# Patient Record
Sex: Female | Born: 1960 | Race: White | Hispanic: No | Marital: Married | Smoking: Never smoker
Health system: Southern US, Community
[De-identification: ages and names within clinical notes are randomized; demographics above are authoritative.]

## PROBLEM LIST (undated history)

## (undated) DIAGNOSIS — C439 Malignant melanoma of skin, unspecified: Secondary | ICD-10-CM

## (undated) HISTORY — PX: OTHER SURGICAL HISTORY: SHX169

## (undated) HISTORY — PX: TUBAL LIGATION: SHX77

---

## 1998-03-16 ENCOUNTER — Other Ambulatory Visit: Admission: RE | Admit: 1998-03-16 | Discharge: 1998-03-16 | Payer: Self-pay | Admitting: Obstetrics and Gynecology

## 1999-03-27 ENCOUNTER — Other Ambulatory Visit: Admission: RE | Admit: 1999-03-27 | Discharge: 1999-03-27 | Payer: Self-pay | Admitting: Obstetrics and Gynecology

## 2000-03-31 ENCOUNTER — Other Ambulatory Visit: Admission: RE | Admit: 2000-03-31 | Discharge: 2000-03-31 | Payer: Self-pay | Admitting: Obstetrics and Gynecology

## 2001-04-12 ENCOUNTER — Other Ambulatory Visit: Admission: RE | Admit: 2001-04-12 | Discharge: 2001-04-12 | Payer: Self-pay | Admitting: Obstetrics and Gynecology

## 2002-01-10 ENCOUNTER — Encounter: Admission: RE | Admit: 2002-01-10 | Discharge: 2002-01-10 | Payer: Self-pay | Admitting: Obstetrics and Gynecology

## 2002-01-10 ENCOUNTER — Encounter: Payer: Self-pay | Admitting: Obstetrics and Gynecology

## 2003-01-19 ENCOUNTER — Encounter: Payer: Self-pay | Admitting: Obstetrics and Gynecology

## 2003-01-19 ENCOUNTER — Encounter: Admission: RE | Admit: 2003-01-19 | Discharge: 2003-01-19 | Payer: Self-pay | Admitting: Obstetrics and Gynecology

## 2004-02-05 ENCOUNTER — Encounter: Admission: RE | Admit: 2004-02-05 | Discharge: 2004-02-05 | Payer: Self-pay | Admitting: Obstetrics and Gynecology

## 2005-03-10 ENCOUNTER — Encounter: Admission: RE | Admit: 2005-03-10 | Discharge: 2005-03-10 | Payer: Self-pay | Admitting: Obstetrics and Gynecology

## 2006-06-10 ENCOUNTER — Encounter: Admission: RE | Admit: 2006-06-10 | Discharge: 2006-06-10 | Payer: Self-pay | Admitting: Obstetrics and Gynecology

## 2007-10-19 ENCOUNTER — Encounter: Admission: RE | Admit: 2007-10-19 | Discharge: 2007-10-19 | Payer: Self-pay | Admitting: Family Medicine

## 2008-11-20 ENCOUNTER — Encounter: Admission: RE | Admit: 2008-11-20 | Discharge: 2008-11-20 | Payer: Self-pay | Admitting: Family Medicine

## 2009-11-19 ENCOUNTER — Ambulatory Visit (HOSPITAL_COMMUNITY): Admission: RE | Admit: 2009-11-19 | Discharge: 2009-11-20 | Payer: Self-pay | Admitting: General Surgery

## 2009-12-17 ENCOUNTER — Inpatient Hospital Stay (HOSPITAL_COMMUNITY): Admission: RE | Admit: 2009-12-17 | Discharge: 2009-12-21 | Payer: Self-pay | Admitting: General Surgery

## 2009-12-17 ENCOUNTER — Encounter (INDEPENDENT_AMBULATORY_CARE_PROVIDER_SITE_OTHER): Payer: Self-pay | Admitting: General Surgery

## 2010-01-04 ENCOUNTER — Encounter: Admission: RE | Admit: 2010-01-04 | Discharge: 2010-01-04 | Payer: Self-pay | Admitting: Family Medicine

## 2010-11-24 ENCOUNTER — Encounter: Payer: Self-pay | Admitting: Obstetrics and Gynecology

## 2010-11-25 ENCOUNTER — Encounter: Payer: Self-pay | Admitting: Family Medicine

## 2010-12-23 ENCOUNTER — Other Ambulatory Visit: Payer: Self-pay | Admitting: Family Medicine

## 2010-12-23 DIAGNOSIS — Z1231 Encounter for screening mammogram for malignant neoplasm of breast: Secondary | ICD-10-CM

## 2011-01-06 ENCOUNTER — Ambulatory Visit
Admission: RE | Admit: 2011-01-06 | Discharge: 2011-01-06 | Disposition: A | Discharge status: Home / Self Care | Payer: Self-pay | Source: Ambulatory Visit | Attending: Family Medicine | Admitting: Family Medicine

## 2011-01-06 DIAGNOSIS — Z1231 Encounter for screening mammogram for malignant neoplasm of breast: Secondary | ICD-10-CM

## 2011-01-20 LAB — ANAEROBIC CULTURE

## 2011-01-20 LAB — CULTURE, ROUTINE-ABSCESS

## 2011-01-20 LAB — CBC
Hemoglobin: 13.5 g/dL (ref 12.0–15.0)
Platelets: 346 10*3/uL (ref 150–400)
RBC: 4.41 MIL/uL (ref 3.87–5.11)
RDW: 13 % (ref 11.5–15.5)

## 2011-01-23 LAB — CBC
HCT: 39 % (ref 36.0–46.0)
MCV: 89.8 fL (ref 78.0–100.0)

## 2011-01-23 LAB — GLUCOSE, CAPILLARY: Glucose-Capillary: 142 mg/dL — ABNORMAL HIGH (ref 70–99)

## 2012-01-07 ENCOUNTER — Other Ambulatory Visit: Payer: Self-pay | Admitting: Obstetrics and Gynecology

## 2012-01-07 DIAGNOSIS — Z1231 Encounter for screening mammogram for malignant neoplasm of breast: Secondary | ICD-10-CM

## 2012-01-14 ENCOUNTER — Ambulatory Visit
Admission: RE | Admit: 2012-01-14 | Discharge: 2012-01-14 | Disposition: A | Discharge status: Home / Self Care | Payer: BC Managed Care – PPO | Source: Ambulatory Visit | Attending: Obstetrics and Gynecology | Admitting: Obstetrics and Gynecology

## 2012-01-14 DIAGNOSIS — Z1231 Encounter for screening mammogram for malignant neoplasm of breast: Secondary | ICD-10-CM

## 2012-05-12 ENCOUNTER — Other Ambulatory Visit: Payer: Self-pay | Admitting: Family Medicine

## 2012-05-12 DIAGNOSIS — Z780 Asymptomatic menopausal state: Secondary | ICD-10-CM

## 2012-05-21 ENCOUNTER — Ambulatory Visit
Admission: RE | Admit: 2012-05-21 | Discharge: 2012-05-21 | Disposition: A | Discharge status: Home / Self Care | Payer: BC Managed Care – PPO | Source: Ambulatory Visit | Attending: Family Medicine | Admitting: Family Medicine

## 2012-05-21 DIAGNOSIS — Z780 Asymptomatic menopausal state: Secondary | ICD-10-CM

## 2012-07-03 ENCOUNTER — Encounter (HOSPITAL_COMMUNITY): Payer: Self-pay | Admitting: *Deleted

## 2012-07-03 ENCOUNTER — Emergency Department (HOSPITAL_COMMUNITY)
Admission: EM | Admit: 2012-07-03 | Discharge: 2012-07-03 | Disposition: A | Discharge status: Home / Self Care | Payer: BC Managed Care – PPO | Source: Home / Self Care | Attending: Family Medicine | Admitting: Family Medicine

## 2012-07-03 DIAGNOSIS — J069 Acute upper respiratory infection, unspecified: Secondary | ICD-10-CM

## 2012-07-03 HISTORY — DX: Malignant melanoma of skin, unspecified: C43.9

## 2012-07-03 NOTE — ED Notes (Signed)
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

## 2012-07-03 NOTE — ED Provider Notes (Signed)
History     CSN: [id number]  Arrival date & time 07/03/12  1103   First MD Initiated Contact with Patient 07/03/12 1122      Chief Complaint  Patient presents with  . Nasal Congestion  . Cough    (Consider location/radiation/quality/duration/timing/severity/associated sxs/prior treatment) Patient is a 51 y.o. female presenting with cough. The history is provided by the patient.  Cough This is a new problem. Episode onset: advised last eve of possible pertussis exposure, pt had tdap booster earlier this year, pt had sl cough and congestion which have improved. The cough is non-productive. There has been no fever. Associated symptoms include rhinorrhea.    Past Medical History  Diagnosis Date  . Melanoma     Past Surgical History  Procedure Date  . Tubal ligation   . Cesarean section   . Necrosis      History reviewed. No pertinent family history.  History  Substance Use Topics  . Smoking status: Never Smoker   . Smokeless tobacco: Not on file  . Alcohol Use: No    OB History    Grav Para Term Preterm Abortions TAB SAB Ect Mult Living                  Review of Systems  Constitutional: Negative.   HENT: Positive for rhinorrhea.   Respiratory: Positive for cough.   Gastrointestinal: Negative.     Allergies  Septra  Home Medications   Current Outpatient Rx  Name Route Sig Dispense Refill  . BIMATOPROST 0.03 % EX SOLN Both Eyes Place into both eyes at bedtime. Place one drop on applicator and apply evenly along the skin of the upper eyelid at base of eyelashes once daily at bedtime; repeat procedure for second eye (use a clean applicator).    . CALCIUM CITRATE-VITAMIN D 250-100 MG-UNIT PO TABS Oral Take 1 tablet by mouth 2 (two) times daily.    Marland Kitchen ZICAM COLD REMEDY PO Oral Take by mouth.    Lenn Sink PED COUGH/COLD CF PO Oral Take by mouth.      BP 139/79  Pulse 73  Temp 98.7 F (37.1 C) (Oral)  Resp 17  SpO2 100%  Physical Exam  Nursing note  and vitals reviewed. Constitutional: She is oriented to person, place, and time. She appears well-developed and well-nourished.  HENT:  Head: Normocephalic.  Right Ear: External ear normal.  Left Ear: External ear normal.  Nose: Nose normal.  Mouth/Throat: Oropharynx is clear and moist.  Eyes: Pupils are equal, round, and reactive to light.  Neck: Normal range of motion. Neck supple.  Cardiovascular: Normal rate, regular rhythm, normal heart sounds and intact distal pulses.   Pulmonary/Chest: Effort normal and breath sounds normal.  Lymphadenopathy:    She has no cervical adenopathy.  Neurological: She is alert and oriented to person, place, and time.  Skin: Skin is warm and dry.    ED Course  Procedures (including critical care time)  Labs Reviewed - No data to display No results found.   1. URI (upper respiratory infection)       MDM         Linna Hoff, MD 07/03/12 1136

## 2012-07-03 NOTE — ED Notes (Signed)
Up to date TDAP may

## 2012-12-29 ENCOUNTER — Other Ambulatory Visit: Payer: Self-pay

## 2012-12-29 ENCOUNTER — Other Ambulatory Visit: Payer: Self-pay | Admitting: Family Medicine

## 2012-12-29 DIAGNOSIS — Z1231 Encounter for screening mammogram for malignant neoplasm of breast: Secondary | ICD-10-CM

## 2013-01-26 ENCOUNTER — Ambulatory Visit
Admission: RE | Admit: 2013-01-26 | Discharge: 2013-01-26 | Disposition: A | Discharge status: Home / Self Care | Payer: BC Managed Care – PPO | Source: Ambulatory Visit

## 2013-01-26 DIAGNOSIS — Z1231 Encounter for screening mammogram for malignant neoplasm of breast: Secondary | ICD-10-CM

## 2013-12-27 ENCOUNTER — Other Ambulatory Visit: Payer: Self-pay

## 2013-12-27 DIAGNOSIS — Z1231 Encounter for screening mammogram for malignant neoplasm of breast: Secondary | ICD-10-CM

## 2014-01-27 ENCOUNTER — Ambulatory Visit
Admission: RE | Admit: 2014-01-27 | Discharge: 2014-01-27 | Disposition: A | Discharge status: Home / Self Care | Payer: BC Managed Care – PPO | Source: Ambulatory Visit

## 2014-01-27 DIAGNOSIS — Z1231 Encounter for screening mammogram for malignant neoplasm of breast: Secondary | ICD-10-CM

## 2014-04-03 ENCOUNTER — Ambulatory Visit (INDEPENDENT_AMBULATORY_CARE_PROVIDER_SITE_OTHER): Payer: BC Managed Care – PPO | Admitting: Physician Assistant

## 2014-04-03 VITALS — BP 110/80 | HR 64 | Temp 97.5°F | Resp 18 | Ht 66.0 in | Wt 162.0 lb

## 2014-04-03 DIAGNOSIS — F411 Generalized anxiety disorder: Secondary | ICD-10-CM

## 2014-04-03 DIAGNOSIS — F419 Anxiety disorder, unspecified: Secondary | ICD-10-CM

## 2014-04-03 LAB — TSH: TSH: 1.193 u[IU]/mL (ref 0.350–4.500)

## 2014-04-03 MED ORDER — ESCITALOPRAM OXALATE 10 MG PO TABS: 10.0000 mg | ORAL_TABLET | Freq: Every day | ORAL | Status: DC

## 2014-04-03 NOTE — Patient Instructions (Signed)
Start the escitalopram (Lexapro).  We are starting with the smallest dose -  10mg  daily.  If the medicine makes you feel sleepy, you can take at bedtime - it lasts 24 hours, so time of day that you take it is not important  If you are seeing improvement but not complete resolution of symptoms, we can always increase the medication  There are fewer medication interactions with this medicine than with others in its class.  There are NO interactions between Lexapro and Cipro, Malarone, Benadryl, Dramamine, or Scopolamine  Please let me know if you are experiencing side effects that you cannot tolerate  This medicine can be discontinued suddenly, but if it causes you to feel bad, we would want to taper over a couple of weeks   Generalized Anxiety Disorder Generalized anxiety disorder (GAD) is a mental disorder. It interferes with life functions, including relationships, work, and school. GAD is different from normal anxiety, which everyone experiences at some point in their lives in response to specific life events and activities. Normal anxiety actually helps [place] prepare for and get through these life events and activities. Normal anxiety goes away after the event or activity is over.  GAD causes anxiety that is not necessarily related to specific events or activities. It also causes excess anxiety in proportion to specific events or activities. The anxiety associated with GAD is also difficult to control. GAD can vary from mild to severe. People with severe GAD can have intense waves of anxiety with physical symptoms (panic attacks).  SYMPTOMS The anxiety and worry associated with GAD are difficult to control. This anxiety and worry are related to many life events and activities and also occur more days than not for 6 months or longer. People with GAD also have three or more of the following symptoms (one or more in children):  Restlessness.   Fatigue.  Difficulty concentrating.    Irritability.  Muscle tension.  Difficulty sleeping or unsatisfying sleep. DIAGNOSIS GAD is diagnosed through an assessment by your caregiver. Your caregiver will ask you questions aboutyour mood,physical symptoms, and events in your life. Your caregiver may ask you about your medical history and use of alcohol or drugs, including prescription medications. Your caregiver may also do a physical exam and blood tests. Certain medical conditions and the use of certain substances can cause symptoms similar to those associated with GAD. Your caregiver may refer you to a mental health specialist for further evaluation. TREATMENT The following therapies are usually used to treat GAD:   Medication Antidepressant medication usually is prescribed for long-term daily control. Antianxiety medications may be added in severe cases, especially when panic attacks occur.   Talk therapy (psychotherapy) Certain types of talk therapy can be helpful in treating GAD by providing support, education, and guidance. A form of talk therapy called cognitive behavioral therapy can teach you healthy ways to think about and react to daily life events and activities.  Stress managementtechniques These include yoga, meditation, and exercise and can be very helpful when they are practiced regularly. A mental health specialist can help determine which treatment is best for you. Some people see improvement with one therapy. However, other people require a combination of therapies. Document Released: 02/14/2013 Document Reviewed: 02/14/2013 [facility] Patient Information 2014 [place], [place].

## 2014-04-03 NOTE — Progress Notes (Signed)
Subjective:    Patient ID: Jessica Branch, female    DOB: 1961/05/31, 53 y.o.   MRN: [id number]  HPI   Jessica Branch is a very pleasant 53 yr old female here to discuss anxiety.  Reports she had significant anxiety years ago and was treated with Paxil.  Believe she took this medicine for several years, has been off for probably 10 years now.  Recently with increased anxiety over the last 5 months or so.  Work is very stressful.  Stress at home - marriage falling apart.  She is seeing a counselor weekly who has been very beneficial.  She prefers not to be on medication but is getting to a point where she thinks she needs it.  Feels constantly anxious, wakes up with heart racing, feels on edge.  Goes from "zero to worst case scenario" in seconds.  Exercise is helping - she is currently walking and trying to get back into running.  Some difficulty sleeping - waking frequently.  She admits to sadness, especially about the ending of her marriage.  She denies guilt or hopelessness.  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    She is concerned about side effect profile of medications.  Particularly wants to avoid weight gain.  Interested in buspar  She does not smoke or drink and uses no substances  PCP is Dr Modena Morrow.  She is on leave until August.  Pt was supposed to see another provider but there has been some difficulty with scheduling and pt felt like she needed to be seen sooner  On June 20th pt leaves for mission trip to [place].  Concerned for potential SEs of medication and being away from medical care.  Additionally wants to make sure that medication will not interact with malarone, cipro, or scopolamine patches  Review of Systems  Constitutional: Negative.   Respiratory: Negative.   Cardiovascular: Positive for palpitations.  Musculoskeletal: Negative.   Skin: Negative.   Psychiatric/Behavioral: Positive for sleep disturbance.  Negative for dysphoric mood. The patient is nervous/anxious.        Objective:   Physical Exam  Vitals reviewed. Constitutional: She is oriented to person, place, and time. She appears well-developed and well-nourished. No distress.  HENT:  Head: Normocephalic and atraumatic.  Eyes: Conjunctivae are normal. No scleral icterus.  Cardiovascular: Normal rate, regular rhythm and normal heart sounds.   Pulmonary/Chest: Effort normal and breath sounds normal. She has no wheezes. She has no rales.  Neurological: She is alert and oriented to person, place, and time.  Skin: Skin is warm and dry.  Psychiatric: She has a normal mood and affect. Her speech is normal and behavior is normal. Judgment and thought content normal.   Results for orders placed in visit on 04/03/14  TSH      Result Value Ref Range   TSH 1.193  0.350 - 4.500 uIU/mL      Assessment & Plan:  Anxiety - Plan: TSH, escitalopram (LEXAPRO) 10 MG tablet   Jessica Branch is a very pleasant 53 yr old female here to initiate medication for anxiety.  We discussed medication options at length including side effect profiles.  Pt would like to avoid weight gain and would like medication to be as short term as possible.  She is interested in buspar but discussed with pt that first line treatment for anxiety begins with ssri.  I think pt's dominant problem is anxiety, but  she may have some underlying depression as well given feelings of sadness and some anhedonia.  I think ssri may be more beneficial than buspar.  With good response to paxil in the past, expect good symptom control with ssri.  Will try Lexapro as this has a better side effect profile.  There are no medication interactions that pt needs to worry about when traveling.  We discussed discontinuation - pt may stop abruptly as long as she feels ok, if discontinuation symptoms would have her taper  Start at 10mg  daily with possibility of increasing dose . Pt to touch base by phone  in 1 month.  Follow up in 8 weeks here or with PCP.  Sooner if concerns  Pt to call or RTC if worsening or not improving  E. Natividad Brood MHS, PA-C [facility] 6/2/20152:05 PM

## 2014-05-03 ENCOUNTER — Telehealth: Payer: Self-pay

## 2014-05-03 MED ORDER — ESCITALOPRAM OXALATE 20 MG PO TABS: 20.0000 mg | ORAL_TABLET | Freq: Every day | ORAL | Status: DC

## 2014-05-03 NOTE — Telephone Encounter (Signed)
Pt called in to report to Natividad Brood that she is seeing some positive results but would like to know if she can go up to the next dosage of Lexipro. If so she needs a new RX for the new dosage. She can be reached [phone] thank you

## 2014-05-03 NOTE — Telephone Encounter (Signed)
Great!  I'm so glad she's seeing some improvement!  I have sent a new rx to her pharmacy - we'll increase the dose to 20mg  daily.  Let's have her follow up in about 6-8 weeks.  Unfortunately, I will not be here, but maybe we can get her scheduled at 104 for follow up - can you forward to scheduling?  Thanks!

## 2014-05-03 NOTE — Telephone Encounter (Signed)
Advised pt. Transferred to schedule appt.

## 2014-06-16 ENCOUNTER — Ambulatory Visit (INDEPENDENT_AMBULATORY_CARE_PROVIDER_SITE_OTHER): Payer: BC Managed Care – PPO | Admitting: Family Medicine

## 2014-06-16 ENCOUNTER — Encounter: Payer: Self-pay | Admitting: Family Medicine

## 2014-06-16 VITALS — BP 102/80 | HR 62 | Temp 98.3°F | Resp 16 | Ht 66.25 in | Wt 168.0 lb

## 2014-06-16 DIAGNOSIS — F419 Anxiety disorder, unspecified: Secondary | ICD-10-CM

## 2014-06-16 DIAGNOSIS — F411 Generalized anxiety disorder: Secondary | ICD-10-CM

## 2014-06-16 MED ORDER — ESCITALOPRAM OXALATE 20 MG PO TABS: 20.0000 mg | ORAL_TABLET | Freq: Every day | ORAL | Status: DC

## 2014-06-16 NOTE — Progress Notes (Signed)
Chief Complaint:  Chief Complaint  Patient presents with  . Follow-up    MEDICATION - LEXAPRO    HPI: Jessica Branch is a 53 y.o. female who is here for Lexapro refill. She is Doing well. No SI/HI/Hallucinations No SEs  Past Medical History  Diagnosis Date  . Melanoma    Past Surgical History  Procedure Laterality Date  . Tubal ligation    . Cesarean section    . Necrosis      History   Social History  . Marital Status: Married    Spouse Name: N/A    Number of Children: N/A  . Years of Education: N/A   Social History Main Topics  . Smoking status: Never Smoker   . Smokeless tobacco: None  . Alcohol Use: No  . Drug Use: No  . Sexual Activity:    Other Topics Concern  . None   Social History Narrative  . None   No family history on file. Allergies  Allergen Reactions  . Septra [Sulfamethoxazole-Trimethoprim]    Prior to Admission medications   Medication Sig Start Date End Date Taking? Authorizing Provider  b complex vitamins capsule Take 1 capsule by mouth daily.   Yes Historical Provider, MD  bimatoprost (LATISSE) 0.03 % ophthalmic solution Place into both eyes at bedtime. Place one drop on applicator and apply evenly along the skin of the upper eyelid at base of eyelashes once daily at bedtime; repeat procedure for second eye (use a clean applicator).   Yes Historical Provider, MD  calcium-vitamin D 250-100 MG-UNIT per tablet Take 1 tablet by mouth 2 (two) times daily.   Yes Historical Provider, MD  escitalopram (LEXAPRO) 20 MG tablet Take 1 tablet (20 mg total) by mouth daily. 06/16/14  Yes Tyia Binford P Kalilah Barua, DO  Multiple Vitamins-Minerals (MULTIVITAMIN WITH MINERALS) tablet Take 1 tablet by mouth daily.   Yes Historical Provider, MD     ROS: The patient denies fevers, chills, night sweats, unintentional weight loss, chest pain, palpitations, wheezing, dyspnea on exertion, nausea, vomiting, abdominal pain, dysuria, hematuria, melena, numbness, weakness,  or tingling.   All other systems have been reviewed and were otherwise negative with the exception of those mentioned in the HPI and as above.    PHYSICAL EXAM: Filed Vitals:   06/16/14 0833  BP: 102/80  Pulse: 62  Temp: 98.3 F (36.8 C)  Resp: 16   Filed Vitals:   06/16/14 0833  Height: 5' 6.25" (1.683 m)  Weight: 168 lb (76.204 kg)   Body mass index is 26.9 kg/(m^2).  General: Alert, no acute distress HEENT:  Normocephalic, atraumatic, oropharynx patent. EOMI, PERRLA Cardiovascular:  Regular rate and rhythm, no rubs murmurs or gallops.  No Carotid bruits, radial pulse intact. No pedal edema.  Respiratory: Clear to auscultation bilaterally.  No wheezes, rales, or rhonchi.  No cyanosis, no use of accessory musculature GI: No organomegaly, abdomen is soft and non-tender, positive bowel sounds.  No masses. Skin: No rashes. Neurologic: Facial musculature symmetric. Psychiatric: Patient is appropriate throughout our interaction. Lymphatic: No cervical lymphadenopathy Musculoskeletal: Gait intact.   LABS: Results for orders placed in visit on 04/03/14  TSH      Result Value Ref Range   TSH 1.193  0.350 - 4.500 uIU/mL     EKG/XRAY:   Primary read interpreted by Dr. Marin Comment at [facility].   ASSESSMENT/PLAN: Encounter Diagnosis  Name Primary?  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 Her ultimate goal is to be off of the lexapro, we have talked about getting rechecked in 6 motnhs and also to notify us and get rechecked prior to weaning off so there is a plan. She has been on SSRIs/SNRIs int he past for situational anxiety/depressionadn hopes this is also the case  and she can utilize her new skills from [place].   Gross sideeffects, risk and benefits, and alternatives of medications d/w patient. Patient is aware that all medications have potential sideeffects and we are unable to predict every sideeffect or drug-drug interaction that may occur.  Jamareon Shimel, [place], DO 06/16/2014 3:26 PM

## 2014-12-14 DIAGNOSIS — M216X2 Other acquired deformities of left foot: Secondary | ICD-10-CM | POA: Insufficient documentation

## 2014-12-14 DIAGNOSIS — M2010 Hallux valgus (acquired), unspecified foot: Secondary | ICD-10-CM | POA: Insufficient documentation

## 2014-12-14 DIAGNOSIS — M205X9 Other deformities of toe(s) (acquired), unspecified foot: Secondary | ICD-10-CM | POA: Insufficient documentation

## 2015-01-22 ENCOUNTER — Other Ambulatory Visit: Payer: Self-pay

## 2015-01-22 DIAGNOSIS — Z1231 Encounter for screening mammogram for malignant neoplasm of breast: Secondary | ICD-10-CM

## 2015-02-05 ENCOUNTER — Ambulatory Visit
Admission: RE | Admit: 2015-02-05 | Discharge: 2015-02-05 | Disposition: A | Discharge status: Home / Self Care | Payer: BC Managed Care – PPO | Source: Ambulatory Visit

## 2015-02-05 ENCOUNTER — Encounter (INDEPENDENT_AMBULATORY_CARE_PROVIDER_SITE_OTHER): Payer: Self-pay

## 2015-02-05 DIAGNOSIS — Z1231 Encounter for screening mammogram for malignant neoplasm of breast: Secondary | ICD-10-CM

## 2015-08-30 ENCOUNTER — Ambulatory Visit (INDEPENDENT_AMBULATORY_CARE_PROVIDER_SITE_OTHER): Payer: BC Managed Care – PPO | Admitting: Family Medicine

## 2015-08-30 VITALS — BP 114/76 | HR 64 | Temp 98.6°F | Resp 16 | Ht 66.0 in | Wt 170.0 lb

## 2015-08-30 DIAGNOSIS — F419 Anxiety disorder, unspecified: Secondary | ICD-10-CM

## 2015-08-30 DIAGNOSIS — N898 Other specified noninflammatory disorders of vagina: Secondary | ICD-10-CM

## 2015-08-30 DIAGNOSIS — N950 Postmenopausal bleeding: Secondary | ICD-10-CM

## 2015-08-30 DIAGNOSIS — Z124 Encounter for screening for malignant neoplasm of cervix: Secondary | ICD-10-CM

## 2015-08-30 LAB — POCT URINE PREGNANCY: PREG TEST UR: NEGATIVE

## 2015-08-30 LAB — POCT WET + KOH PREP
TRICH BY WET PREP: ABSENT
YEAST BY WET PREP: ABSENT
Yeast by KOH: ABSENT

## 2015-08-30 MED ORDER — ESCITALOPRAM OXALATE 20 MG PO TABS: 20.0000 mg | ORAL_TABLET | Freq: Every day | ORAL | Status: DC

## 2015-08-30 NOTE — Progress Notes (Signed)
[facility] [address], [place] [address] [phone]  Date:  08/30/2015   Name:  Jessica Branch   DOB:  1961/05/19   MRN:  [id number]  PCP:  No primary care provider on file.    Chief Complaint: blood spotting and Vaginal Discharge   History of Present Illness:  Jessica Branch is a 54 y.o. very pleasant female patient who presents with the following:  Established pt here today with concern of vaginal discharge for a couple of weeks- it had been "just clear liquid". However last night she noted some blood- spotting from her vagina, some bright red.   This concerned her so she came in to be seen   She has an appt with her GYN next month.  Dr. Gaetano Net She has not noted any pain really- "every so often I felt like there was something there, but it's not really noticeable."   She has not noted any urinary sx such as dysuria, hematuria.    She is post- menopausal for 3-4 years.   She has not had intercourse in about a year or more- she and her husband separated nearly 6 months ago. No new partners.   Her last pap was approx 4 years ago  History of BTL and C/S in the past   She had a thin, small melanoma in the past- this was dx in 2013.  She is now followed by 6 month skin checks   She has used lexapro in the past but is no longer taking this. She does recognize that she has sx of depression, but has been trying to handle this with therapy.  She does not have any SI.  At this time she would like to have an rx for lexapro to take if needed, but does not know for sure if she wants to go back on this   There are no active problems to display for this patient.   Past Medical History  Diagnosis Date  . Melanoma [facility])     Past Surgical History  Procedure Laterality Date  . Tubal ligation    . Cesarean section    . Necrosis       Social History  Substance Use Topics  . Smoking status: Never Smoker   . Smokeless tobacco: Never Used  . Alcohol  Use: No    History reviewed. No pertinent family history.  Allergies  Allergen Reactions  . Septra [Sulfamethoxazole-Trimethoprim] Other (See Comments)    Pain in pelvic area   . Amoxicillin Rash    Medication list has been reviewed and updated.  Current Outpatient Prescriptions on File Prior to Visit  Medication Sig Dispense Refill  . b complex vitamins capsule Take 1 capsule by mouth daily.    . calcium-vitamin D 250-100 MG-UNIT per tablet Take 1 tablet by mouth 2 (two) times daily.    . Multiple Vitamins-Minerals (MULTIVITAMIN WITH MINERALS) tablet Take 1 tablet by mouth daily.    Marland Kitchen escitalopram (LEXAPRO) 20 MG tablet Take 1 tablet (20 mg total) by mouth daily. (Patient not taking: Reported on 08/30/2015) 30 tablet 11   No current facility-administered medications on file prior to visit.    Review of Systems:  As per HPI- otherwise negative.   Physical Examination: Filed Vitals:   08/30/15 1244  BP: 114/76  Pulse: 64  Temp: 98.6 F (37 C)  Resp: 16   Filed Vitals:   08/30/15 1244  Height: 5\' 6"  (1.676 m)  Weight: 170 lb (  77.111 kg)   Body mass index is 27.45 kg/(m^2). Ideal Body Weight: Weight in (lb) to have BMI = 25: 154.6  GEN: WDWN, NAD, Non-toxic, A & O x 3, looks well HEENT: Atraumatic, Normocephalic. Neck supple. No masses, No LAD. Ears and Nose: No external deformity. CV: RRR, No M/G/R. No JVD. No thrill. No extra heart sounds. PULM: CTA B, no wheezes, crackles, rhonchi. No retractions. No resp. distress. No accessory muscle use. ABD: S, NT, ND. No rebound. No HSM. EXTR: No c/c/e NEURO Normal gait.  PSYCH: Normally interactive. Conversant. Worried about meaning of bleeding, tearful at times Pelvic: normal vagina and cervix. Uterus normal, no CMT, no adnexal tendereness or masses There is scant blood from the cervical os in the vaginal canal   Results for orders placed or performed in visit on 08/30/15  POCT Wet + KOH Prep  Result Value Ref Range    Yeast by KOH Absent Present, Absent   Yeast by wet prep Absent Present, Absent   WBC by wet prep Few None, Few, Too numerous to count   Clue Cells Wet Prep HPF POC None None, Too numerous to count   Trich by wet prep Absent Present, Absent   Bacteria Wet Prep HPF POC None None, Few, Too numerous to count   Epithelial Cells By [employer] ([facility]) Few None, Few, Too numerous to count   RBC,UR,HPF,POC Moderate (A) None RBC/hpf  POCT urine pregnancy  Result Value Ref Range   Preg Test, Ur Negative Negative     Assessment and Plan: Post-menopausal bleeding - Plan: POCT urine pregnancy, Pap IG, CT/NG NAA, and HPV (high risk)  Vaginal irritation - Plan: POCT Wet + KOH Prep  Screening for cervical cancer - Plan: Pap IG, CT/NG NAA, and HPV (high risk)  Anxiety - Plan: escitalopram (LEXAPRO) 20 MG tablet  Here today with post-menopausal bleeding.  She is quite worried about what this might mean- tried to reassure her that as she attended to this promptly we hope all will be ok.  Was able to mover her appt with OBG to next week Did pap today Refilled her lexapro- she will think about going back on this. Asked her to contact me if we can help in any way  Signed Lamar Blinks, MD  11/2 at 3:00

## 2015-08-30 NOTE — Patient Instructions (Signed)
You have an appt with Dr. Gaetano Net on 11/2 at 3:20 pm- please arrive at 3pm.    By that time we should have your pap smear back which may be helpful Your tests today are normal  I am sorry that you are having this bleeding- we hope that all will be taken care of soon!  If you have any heavier bleeding, pain, or other concerns let me know  I did give you an rx for your lexapro to hold onto- please consider starting back on this if you feel like your depression is getting worse or becoming more bothersome

## 2015-09-03 LAB — PAP IG, CT-NG NAA, HPV HIGH-RISK
Chlamydia Probe Amp: NEGATIVE
GC PROBE AMP: NEGATIVE
HPV DNA High Risk: NOT DETECTED

## 2016-01-10 ENCOUNTER — Other Ambulatory Visit: Payer: Self-pay

## 2016-01-10 DIAGNOSIS — Z1231 Encounter for screening mammogram for malignant neoplasm of breast: Secondary | ICD-10-CM

## 2016-02-11 ENCOUNTER — Ambulatory Visit
Admission: RE | Admit: 2016-02-11 | Discharge: 2016-02-11 | Disposition: A | Discharge status: Home / Self Care | Payer: BC Managed Care – PPO | Source: Ambulatory Visit

## 2016-02-11 DIAGNOSIS — Z1231 Encounter for screening mammogram for malignant neoplasm of breast: Secondary | ICD-10-CM

## 2016-02-18 ENCOUNTER — Other Ambulatory Visit: Payer: Self-pay | Admitting: Obstetrics and Gynecology

## 2016-02-18 DIAGNOSIS — R928 Other abnormal and inconclusive findings on diagnostic imaging of breast: Secondary | ICD-10-CM

## 2016-02-19 ENCOUNTER — Ambulatory Visit
Admission: RE | Admit: 2016-02-19 | Discharge: 2016-02-19 | Disposition: A | Discharge status: Home / Self Care | Payer: BC Managed Care – PPO | Source: Ambulatory Visit | Attending: Obstetrics and Gynecology | Admitting: Obstetrics and Gynecology

## 2016-02-19 DIAGNOSIS — R928 Other abnormal and inconclusive findings on diagnostic imaging of breast: Secondary | ICD-10-CM

## 2016-02-22 ENCOUNTER — Other Ambulatory Visit: Payer: BC Managed Care – PPO

## 2016-04-25 ENCOUNTER — Ambulatory Visit (INDEPENDENT_AMBULATORY_CARE_PROVIDER_SITE_OTHER): Payer: BC Managed Care – PPO | Admitting: Family Medicine

## 2016-04-25 VITALS — BP 120/78 | HR 70 | Temp 98.2°F | Resp 17 | Ht 66.0 in | Wt 167.0 lb

## 2016-04-25 DIAGNOSIS — S39012A Strain of muscle, fascia and tendon of lower back, initial encounter: Secondary | ICD-10-CM

## 2016-04-25 MED ORDER — CYCLOBENZAPRINE HCL 10 MG PO TABS: 10.0000 mg | ORAL_TABLET | Freq: Three times a day (TID) | ORAL | Status: DC | PRN

## 2016-04-25 NOTE — Patient Instructions (Addendum)
Please come back in if you are not better in 2 weeks  Low Back Strain With Rehab A strain is an injury in which a tendon or muscle is torn. The muscles and tendons of the lower back are vulnerable to strains. However, these muscles and tendons are very strong and require a great force to be injured. Strains are classified into three categories. Grade 1 strains cause pain, but the tendon is not lengthened. Grade 2 strains include a lengthened ligament, due to the ligament being stretched or partially ruptured. With grade 2 strains there is still function, although the function may be decreased. Grade 3 strains involve a complete tear of the tendon or muscle, and function is usually impaired. SYMPTOMS   Pain in the lower back.  Pain that affects one side more than the other.  Pain that gets worse with movement and may be felt in the hip, buttocks, or back of the thigh.  Muscle spasms of the muscles in the back.  Swelling along the muscles of the back.  Loss of strength of the back muscles.  Crackling sound (crepitation) when the muscles are touched. CAUSES  Lower back strains occur when a force is placed on the muscles or tendons that is greater than they can handle. Common causes of injury include:  Prolonged overuse of the muscle-tendon units in the lower back, usually from incorrect posture.  A single violent injury or force applied to the back. RISK INCREASES WITH:  Sports that involve twisting forces on the spine or a lot of bending at the waist (football, rugby, weightlifting, bowling, golf, tennis, speed skating, racquetball, swimming, running, gymnastics, diving).  Poor strength and flexibility.  Failure to warm up properly before activity.  Family history of lower back pain or disk disorders.  Previous back injury or surgery (especially fusion).  Poor posture with lifting, especially heavy objects.  Prolonged sitting, especially with poor posture. PREVENTION   Learn  and use proper posture when sitting or lifting (maintain proper posture when sitting, lift using the knees and legs, not at the waist).  Warm up and stretch properly before activity.  Allow for adequate recovery between workouts.  Maintain physical fitness:  Strength, flexibility, and endurance.  Cardiovascular fitness. PROGNOSIS  If treated properly, lower back strains usually heal within 6 weeks. RELATED COMPLICATIONS   Recurring symptoms, resulting in a chronic problem.  Chronic inflammation, scarring, and partial muscle-tendon tear.  Delayed healing or resolution of symptoms.  Prolonged disability. TREATMENT  Treatment first involves the use of ice and medicine, to reduce pain and inflammation. The use of strengthening and stretching exercises may help reduce pain with activity. These exercises may be performed at home or with a therapist. Severe injuries may require referral to a therapist for further evaluation and treatment, such as ultrasound. Your caregiver may advise that you wear a back brace or corset, to help reduce pain and discomfort. Often, prolonged bed rest results in greater harm then benefit. Corticosteroid injections may be recommended. However, these should be reserved for the most serious cases. It is important to avoid using your back when lifting objects. At night, sleep on your back on a firm mattress with a pillow placed under your knees. If non-surgical treatment is unsuccessful, surgery may be needed.  MEDICATION   If pain medicine is needed, nonsteroidal anti-inflammatory medicines (aspirin and ibuprofen), or other minor pain relievers (acetaminophen), are often advised.  Do not take pain medicine for 7 days before surgery.  Prescription pain relievers  may be given, if your caregiver thinks they are needed. Use only as directed and only as much as you need.  Ointments applied to the skin may be helpful.  Corticosteroid injections may be given by your  caregiver. These injections should be reserved for the most serious cases, because they may only be given a certain number of times. HEAT AND COLD  Cold treatment (icing) should be applied for 10 to 15 minutes every 2 to 3 hours for inflammation and pain, and immediately after activity that aggravates your symptoms. Use ice packs or an ice massage.  Heat treatment may be used before performing stretching and strengthening activities prescribed by your caregiver, physical therapist, or athletic trainer. Use a heat pack or a warm water soak. SEEK MEDICAL CARE IF:   Symptoms get worse or do not improve in 2 to 4 weeks, despite treatment.  You develop numbness, weakness, or loss of bowel or bladder function.  New, unexplained symptoms develop. (Drugs used in treatment may produce side effects.) EXERCISES  RANGE OF MOTION (ROM) AND STRETCHING EXERCISES - Low Back Strain Most people with lower back pain will find that their symptoms get worse with excessive bending forward (flexion) or arching at the lower back (extension). The exercises which will help resolve your symptoms will focus on the opposite motion.  Your physician, physical therapist or athletic trainer will help you determine which exercises will be most helpful to resolve your lower back pain. Do not complete any exercises without first consulting with your caregiver. Discontinue any exercises which make your symptoms worse until you speak to your caregiver.  If you have pain, numbness or tingling which travels down into your buttocks, leg or foot, the goal of the therapy is for these symptoms to move closer to your back and eventually resolve. Sometimes, these leg symptoms will get better, but your lower back pain may worsen. This is typically an indication of progress in your rehabilitation. Be very alert to any changes in your symptoms and the activities in which you participated in the 24 hours prior to the change. Sharing this  information with your caregiver will allow him/her to most efficiently treat your condition.  These exercises may help you when beginning to rehabilitate your injury. Your symptoms may resolve with or without further involvement from your physician, physical therapist or athletic trainer. While completing these exercises, remember:  Restoring tissue flexibility helps normal motion to return to the joints. This allows healthier, less painful movement and activity.  An effective stretch should be held for at least 30 seconds.  A stretch should never be painful. You should only feel a gentle lengthening or release in the stretched tissue. FLEXION RANGE OF MOTION AND STRETCHING EXERCISES: STRETCH - Flexion, Single Knee to Chest   Lie on a firm bed or floor with both legs extended in front of you.  Keeping one leg in contact with the floor, bring your opposite knee to your chest. Hold your leg in place by either grabbing behind your thigh or at your knee.  Pull until you feel a gentle stretch in your lower back. Hold __________ seconds.  Slowly release your grasp and repeat the exercise with the opposite side. Repeat __________ times. Complete this exercise __________ times per day.  STRETCH - Flexion, Double Knee to Chest   Lie on a firm bed or floor with both legs extended in front of you.  Keeping one leg in contact with the floor, bring your opposite knee to  your chest.  Tense your stomach muscles to support your back and then lift your other knee to your chest. Hold your legs in place by either grabbing behind your thighs or at your knees.  Pull both knees toward your chest until you feel a gentle stretch in your lower back. Hold __________ seconds.  Tense your stomach muscles and slowly return one leg at a time to the floor. Repeat __________ times. Complete this exercise __________ times per day.  STRETCH - Low Trunk Rotation  Lie on a firm bed or floor. Keeping your legs in front  of you, bend your knees so they are both pointed toward the ceiling and your feet are flat on the floor.  Extend your arms out to the side. This will stabilize your upper body by keeping your shoulders in contact with the floor.  Gently and slowly drop both knees together to one side until you feel a gentle stretch in your lower back. Hold for __________ seconds.  Tense your stomach muscles to support your lower back as you bring your knees back to the starting position. Repeat the exercise to the other side. Repeat __________ times. Complete this exercise __________ times per day  EXTENSION RANGE OF MOTION AND FLEXIBILITY EXERCISES: STRETCH - Extension, Prone on Elbows   Lie on your stomach on the floor, a bed will be too soft. Place your palms about shoulder width apart and at the height of your head.  Place your elbows under your shoulders. If this is too painful, stack pillows under your chest.  Allow your body to relax so that your hips drop lower and make contact more completely with the floor.  Hold this position for __________ seconds.  Slowly return to lying flat on the floor. Repeat __________ times. Complete this exercise __________ times per day.  RANGE OF MOTION - Extension, Prone Press Ups  Lie on your stomach on the floor, a bed will be too soft. Place your palms about shoulder width apart and at the height of your head.  Keeping your back as relaxed as possible, slowly straighten your elbows while keeping your hips on the floor. You may adjust the placement of your hands to maximize your comfort. As you gain motion, your hands will come more underneath your shoulders.  Hold this position __________ seconds.  Slowly return to lying flat on the floor. Repeat __________ times. Complete this exercise __________ times per day.  RANGE OF MOTION- Quadruped, Neutral Spine   Assume a hands and knees position on a firm surface. Keep your hands under your shoulders and your  knees under your hips. You may place padding under your knees for comfort.  Drop your head and point your tail bone toward the ground below you. This will round out your lower back like an angry cat. Hold this position for __________ seconds.  Slowly lift your head and release your tail bone so that your back sags into a large arch, like an old horse.  Hold this position for __________ seconds.  Repeat this until you feel limber in your lower back.  Now, find your "sweet spot." This will be the most comfortable position somewhere between the two previous positions. This is your neutral spine. Once you have found this position, tense your stomach muscles to support your lower back.  Hold this position for __________ seconds. Repeat __________ times. Complete this exercise __________ times per day.  STRENGTHENING EXERCISES - Low Back Strain These exercises may help you when  beginning to rehabilitate your injury. These exercises should be done near your "sweet spot." This is the neutral, low-back arch, somewhere between fully rounded and fully arched, that is your least painful position. When performed in this safe range of motion, these exercises can be used for people who have either a flexion or extension based injury. These exercises may resolve your symptoms with or without further involvement from your physician, physical therapist or athletic trainer. While completing these exercises, remember:   Muscles can gain both the endurance and the strength needed for everyday activities through controlled exercises.  Complete these exercises as instructed by your physician, physical therapist or athletic trainer. Increase the resistance and repetitions only as guided.  You may experience muscle soreness or fatigue, but the pain or discomfort you are trying to eliminate should never worsen during these exercises. If this pain does worsen, stop and make certain you are following the directions  exactly. If the pain is still present after adjustments, discontinue the exercise until you can discuss the trouble with your caregiver. STRENGTHENING - Deep Abdominals, Pelvic Tilt  Lie on a firm bed or floor. Keeping your legs in front of you, bend your knees so they are both pointed toward the ceiling and your feet are flat on the floor.  Tense your lower abdominal muscles to press your lower back into the floor. This motion will rotate your pelvis so that your tail bone is scooping upwards rather than pointing at your feet or into the floor.  With a gentle tension and even breathing, hold this position for __________ seconds. Repeat __________ times. Complete this exercise __________ times per day.  STRENGTHENING - Abdominals, Crunches   Lie on a firm bed or floor. Keeping your legs in front of you, bend your knees so they are both pointed toward the ceiling and your feet are flat on the floor. Cross your arms over your chest.  Slightly tip your chin down without bending your neck.  Tense your abdominals and slowly lift your trunk high enough to just clear your shoulder blades. Lifting higher can put excessive stress on the lower back and does not further strengthen your abdominal muscles.  Control your return to the starting position. Repeat __________ times. Complete this exercise __________ times per day.  STRENGTHENING - Quadruped, Opposite UE/LE Lift   Assume a hands and knees position on a firm surface. Keep your hands under your shoulders and your knees under your hips. You may place padding under your knees for comfort.  Find your neutral spine and gently tense your abdominal muscles so that you can maintain this position. Your shoulders and hips should form a rectangle that is parallel with the floor and is not twisted.  Keeping your trunk steady, lift your right hand no higher than your shoulder and then your left leg no higher than your hip. Make sure you are not holding your  breath. Hold this position __________ seconds.  Continuing to keep your abdominal muscles tense and your back steady, slowly return to your starting position. Repeat with the opposite arm and leg. Repeat __________ times. Complete this exercise __________ times per day.  STRENGTHENING - Lower Abdominals, Double Knee Lift  Lie on a firm bed or floor. Keeping your legs in front of you, bend your knees so they are both pointed toward the ceiling and your feet are flat on the floor.  Tense your abdominal muscles to brace your lower back and slowly lift both of your knees  until they come over your hips. Be certain not to hold your breath.  Hold __________ seconds. Using your abdominal muscles, return to the starting position in a slow and controlled manner. Repeat __________ times. Complete this exercise __________ times per day.  POSTURE AND BODY MECHANICS CONSIDERATIONS - Low Back Strain Keeping correct posture when sitting, standing or completing your activities will reduce the stress put on different body tissues, allowing injured tissues a chance to heal and limiting painful experiences. The following are general guidelines for improved posture. Your physician or physical therapist will provide you with any instructions specific to your needs. While reading these guidelines, remember:  The exercises prescribed by your provider will help you have the flexibility and strength to maintain correct postures.  The correct posture provides the best environment for your joints to work. All of your joints have less wear and tear when properly supported by a spine with good posture. This means you will experience a healthier, less painful body.  Correct posture must be practiced with all of your activities, especially prolonged sitting and standing. Correct posture is as important when doing repetitive low-stress activities (typing) as it is when doing a single heavy-load activity (lifting). RESTING  POSITIONS Consider which positions are most painful for you when choosing a resting position. If you have pain with flexion-based activities (sitting, bending, stooping, squatting), choose a position that allows you to rest in a less flexed posture. You would want to avoid curling into a fetal position on your side. If your pain worsens with extension-based activities (prolonged standing, working overhead), avoid resting in an extended position such as sleeping on your stomach. Most people will find more comfort when they rest with their spine in a more neutral position, neither too rounded nor too arched. Lying on a non-sagging bed on your side with a pillow between your knees, or on your back with a pillow under your knees will often provide some relief. Keep in mind, being in any one position for a prolonged period of time, no matter how correct your posture, can still lead to stiffness. PROPER SITTING POSTURE In order to minimize stress and discomfort on your spine, you must sit with correct posture. Sitting with good posture should be effortless for a healthy body. Returning to good posture is a gradual process. Many people can work toward this most comfortably by using various supports until they have the flexibility and strength to maintain this posture on their own. When sitting with proper posture, your ears will fall over your shoulders and your shoulders will fall over your hips. You should use the back of the chair to support your upper back. Your lower back will be in a neutral position, just slightly arched. You may place a small pillow or folded towel at the base of your lower back for support.  When working at a desk, create an environment that supports good, upright posture. Without extra support, muscles tire, which leads to excessive strain on joints and other tissues. Keep these recommendations in mind: CHAIR:  A chair should be able to slide under your desk when your back makes contact  with the back of the chair. This allows you to work closely.  The chair's height should allow your eyes to be level with the upper part of your monitor and your hands to be slightly lower than your elbows. BODY POSITION  Your feet should make contact with the floor. If this is not possible, use a foot rest.  Keep your ears over your shoulders. This will reduce stress on your neck and lower back. INCORRECT SITTING POSTURES  If you are feeling tired and unable to assume a healthy sitting posture, do not slouch or slump. This puts excessive strain on your back tissues, causing more damage and pain. Healthier options include:  Using more support, like a lumbar pillow.  Switching tasks to something that requires you to be upright or walking.  Talking a brief walk.  Lying down to rest in a neutral-spine position. PROLONGED STANDING WHILE SLIGHTLY LEANING FORWARD  When completing a task that requires you to lean forward while standing in one place for a long time, place either foot up on a stationary 2-4 inch high object to help maintain the best posture. When both feet are on the ground, the lower back tends to lose its slight inward curve. If this curve flattens (or becomes too large), then the back and your other joints will experience too much stress, tire more quickly, and can cause pain. CORRECT STANDING POSTURES Proper standing posture should be assumed with all daily activities, even if they only take a few moments, like when brushing your teeth. As in sitting, your ears should fall over your shoulders and your shoulders should fall over your hips. You should keep a slight tension in your abdominal muscles to brace your spine. Your tailbone should point down to the ground, not behind your body, resulting in an over-extended swayback posture.  INCORRECT STANDING POSTURES  Common incorrect standing postures include a forward head, locked knees and/or an excessive swayback. WALKING Walk with  an upright posture. Your ears, shoulders and hips should all line-up. PROLONGED ACTIVITY IN A FLEXED POSITION When completing a task that requires you to bend forward at your waist or lean over a low surface, try to find a way to stabilize 3 out of 4 of your limbs. You can place a hand or elbow on your thigh or rest a knee on the surface you are reaching across. This will provide you more stability so that your muscles do not fatigue as quickly. By keeping your knees relaxed, or slightly bent, you will also reduce stress across your lower back. CORRECT LIFTING TECHNIQUES DO :   Assume a wide stance. This will provide you more stability and the opportunity to get as close as possible to the object which you are lifting.  Tense your abdominals to brace your spine. Bend at the knees and hips. Keeping your back locked in a neutral-spine position, lift using your leg muscles. Lift with your legs, keeping your back straight.  Test the weight of unknown objects before attempting to lift them.  Try to keep your elbows locked down at your sides in order get the best strength from your shoulders when carrying an object.  Always ask for help when lifting heavy or awkward objects. INCORRECT LIFTING TECHNIQUES DO NOT:   Lock your knees when lifting, even if it is a small object.  Bend and twist. Pivot at your feet or move your feet when needing to change directions.  Assume that you can safely pick up even a paper clip without proper posture.   This information is not intended to replace advice given to you by your health care provider. Make sure you discuss any questions you have with your health care provider.   Document Released: 10/20/2005 Document Revised: 11/10/2014 Document Reviewed: 02/01/2009 Elsevier Interactive Patient Education 2016 [employer].     IF you received  an x-ray today, you will receive an invoice from [facility]. Please contact [facility] at  [phone] with questions or concerns regarding your invoice.   IF you received labwork today, you will receive an invoice from [employer]. Please contact [facility] at [phone] with questions or concerns regarding your invoice.   Our billing staff will not be able to assist you with questions regarding bills from these companies.  You will be contacted with the lab results as soon as they are available. The fastest way to get your results is to activate your My Chart account. Instructions are located on the last page of this paperwork. If you have not heard from [place] regarding the results in 2 weeks, please contact this office.

## 2016-04-25 NOTE — Progress Notes (Signed)
   Subjective:    Patient ID: Jessica Branch, female    DOB: 1961/07/30, 55 y.o.   MRN: [id number]  HPI This is a 55 yo female who presents today following a fall last night. She was taking her dog out last night and he pulled her, causing her to fall onto grass and pine straw. She fell sideways and immediately had pain on her lower back. A little more discomfort on her left side. She got back home and went to bed, she was very stiff and sore when she got up this morning. She took advil 2 tabs with a little relief. Did not try ice or heat.   Past Medical History  Diagnosis Date  . Melanoma [facility])    Past Surgical History  Procedure Laterality Date  . Tubal ligation    . Cesarean section    . Necrosis     No family history on file. Social History  Substance Use Topics  . Smoking status: Never Smoker   . Smokeless tobacco: Never Used  . Alcohol Use: No    Review of Systems  Respiratory: Negative for shortness of breath.   Cardiovascular: Negative for chest pain.  Gastrointestinal: Negative for abdominal pain.       No fecal incontinence.   Genitourinary:       No urinary incontinence.   Musculoskeletal: Positive for back pain. Negative for neck pain.  Neurological: Negative for weakness and numbness.       Objective:   Physical Exam  Constitutional: She is oriented to person, place, and time. She appears well-developed and well-nourished. No distress.  HENT:  Head: Normocephalic and atraumatic.  Neck: Normal range of motion. Neck supple.  Cardiovascular: Normal rate.   Pulmonary/Chest: Effort normal.  Musculoskeletal: Normal range of motion. She exhibits no edema.       Cervical back: Normal.       Thoracic back: Normal.       Lumbar back: She exhibits tenderness (bilateral muscular tenderness, left > right ). She exhibits normal range of motion, no bony tenderness, no swelling, no edema and no deformity.  Neurological: She is alert and oriented to person, place, and  time. She has normal reflexes.  Skin: Skin is warm and dry. She is not diaphoretic.  Psychiatric: She has a normal mood and affect. Her behavior is normal. Judgment and thought content normal.  Vitals reviewed.     BP 120/78 mmHg  Pulse 70  Temp(Src) 98.2 F (36.8 C) (Oral)  Resp 17  Ht 5\' 6"  (1.676 m)  Wt 167 lb (75.751 kg)  BMI 26.97 kg/m2  SpO2 99%     Assessment & Plan:  1. Low back strain, initial encounter - Provided written and verbal information regarding diagnosis and treatment. - can take ibuprofen 2-3 tablets every 8-12 hours prn - cyclobenzaprine (FLEXERIL) 10 MG tablet; Take 1 tablet (10 mg total) by mouth 3 (three) times daily as needed for muscle spasms.  Dispense: 30 tablet; Refill: 0 - encouraged her to avoid sitting/standing in one position for more than 30 minutes while awake, perform gentle ROM 3x/day - RTC precautions reviewed  Clarene Reamer, FNP-BC  [facility]  04/25/2016 1:23 PM

## 2016-04-29 ENCOUNTER — Telehealth: Payer: Self-pay

## 2016-04-29 NOTE — Telephone Encounter (Signed)
Pt is making progress with the back injury. However she's not having a bowel movement, she hasn't used the bathroom since Friday she's been taking miralax and she wanted to know if she can take more than one dose of miralax? Please call patient to let her know what can be done.

## 2016-04-29 NOTE — Telephone Encounter (Signed)
She can double her miralax and/or take a dose of duclolax daily.  If she is not moving her bowels by Friday then she needs to come back in for imaging.  Philis Fendt, MS, PA-C 1:03 PM, 04/29/2016

## 2016-09-26 ENCOUNTER — Ambulatory Visit (INDEPENDENT_AMBULATORY_CARE_PROVIDER_SITE_OTHER): Payer: BC Managed Care – PPO | Admitting: Physician Assistant

## 2016-09-26 VITALS — BP 104/82 | HR 74 | Temp 98.5°F | Resp 16 | Ht 66.0 in | Wt 176.0 lb

## 2016-09-26 DIAGNOSIS — H1032 Unspecified acute conjunctivitis, left eye: Secondary | ICD-10-CM

## 2016-09-26 MED ORDER — OFLOXACIN 0.3 % OP SOLN: 1.0000 [drp] | OPHTHALMIC | 0 refills | Status: AC

## 2016-09-26 NOTE — Progress Notes (Signed)
Patient ID: Jessica Branch, female    DOB: Mar 11, 1961, 55 y.o.   MRN: [id number]  PCP: No primary care provider on file.  Chief Complaint  Patient presents with  . Eye Pain    stinging, burning, pain started yesterday     Subjective:   Presents for evaluation of LEFT eye pain.  First noted symptoms yesterday when she woke up. She went ahead and put her contacts in and went to [facility] for the day. About 6:30 pm she removed the lenses and is wearing glasses. Daily contacts, removes every night. Notes that they are very thin. Doesn't think it tore.  Upper lid is tender, initially medially. Now mamximum tenderness is middle of the eyelid. Eye now red. No drainage, but some watering. Not like pink eye she's had in the past (that's not been painful).  No change in vision.  Feels better to have the eyes closed, but blinking increases the pain.  Contacted her eye doctor, but hasn't gotten a call back yet and is too uncomfortable to wait.    Review of Systems As above. No fever/chills. No nausea/vomiting.     Patient Active Problem List   Diagnosis Date Noted  . 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 Authorizing Provider  b complex vitamins capsule Take 1 capsule by mouth daily.   Yes Historical Provider, MD  calcium-vitamin D 250-100 MG-UNIT per tablet Take 1 tablet by mouth 2 (two) times daily.   Yes Historical Provider, MD  Multiple Vitamins-Minerals (MULTIVITAMIN WITH MINERALS) tablet Take 1 tablet by mouth daily.   Yes Historical Provider, MD     Allergies  Allergen Reactions  . Septra [Sulfamethoxazole-Trimethoprim] Other (See Comments)    Pain in pelvic area   . Amoxicillin Rash       Objective:  Physical Exam  Constitutional: She is oriented to person, place, and time. She appears  well-developed and well-nourished. She is active and cooperative. No distress.  BP 104/82 (BP Location: Right Arm, Patient Position: Sitting, Cuff Size: Small)   Pulse 74   Temp 98.5 F (36.9 C) (Oral)   Resp 16   Ht 5\' 6"  (1.676 m)   Wt 176 lb (79.8 kg)   SpO2 98%   BMI 28.41 kg/m    Eyes: EOM are normal. Pupils are equal, round, and reactive to light. Lids are everted and swept, no foreign bodies found. Left eye exhibits no chemosis, no discharge, no exudate and no hordeolum. No foreign body present in the left eye. Right conjunctiva is not injected. Right conjunctiva has no hemorrhage. Left conjunctiva is injected (mildly). Left conjunctiva has no hemorrhage. No scleral icterus.  Fundoscopic exam:      The right eye shows no exudate, no hemorrhage and no papilledema. The right eye shows red reflex.       The left eye shows no exudate, no hemorrhage and no papilledema. The left eye shows red reflex.  Slit lamp exam:      The left eye shows no corneal abrasion, no corneal flare, no corneal ulcer, no foreign body, no hyphema, no hypopyon and no fluorescein uptake.    Upper lid with mild edema. Not erythematous. No drainage.  Verbal consent obtained.  The eye was anesthetized with 2 drops of ophthaine, and stained with fluorescein. Examination under woods lamp does not reveal  a foreign body or area of increased stain uptake.  Very difficult to evert the lid. The eye was then irrigated copiously with saline.   Pulmonary/Chest: Effort normal.  Neurological: She is alert and oriented to person, place, and time.  Psychiatric: She has a normal mood and affect. Her speech is normal and behavior is normal.        Visual Acuity Screening   Right eye Left eye Both eyes  Without correction:     With correction: 20/20 20/20 20/20        Assessment & Plan:   1. Acute conjunctivitis of left eye, unspecified acute conjunctivitis type Given her discomfort and contact lens use, cover for  bacterial cause. NSAIDS. Warm compresses. Follow-up with her eye specialist early next week, go to the ED if symptoms worsen over the weekend. - ofloxacin (OCUFLOX) 0.3 % ophthalmic solution; Place 1 drop into the left eye every 4 (four) hours. While awake  Dispense: 5 mL; Refill: 0   Fara Chute, PA-C Physician Assistant-Certified [facility]

## 2016-09-26 NOTE — Patient Instructions (Addendum)
Use warm compresses, and OTC ibuprofen. See your eye specialist early next week if the symptoms persist. Go to the emergency department if the symptoms worsen, or if your vision changes.    IF you received an x-ray today, you will receive an invoice from [facility]. Please contact [facility] at [phone] with questions or concerns regarding your invoice.   IF you received labwork today, you will receive an invoice from [employer]. Please contact [facility] at [phone] with questions or concerns regarding your invoice.   Our billing staff will not be able to assist you with questions regarding bills from these companies.  You will be contacted with the lab results as soon as they are available. The fastest way to get your results is to activate your My Chart account. Instructions are located on the last page of this paperwork. If you have not heard from [place] regarding the results in 2 weeks, please contact this office.

## 2016-11-11 ENCOUNTER — Ambulatory Visit (INDEPENDENT_AMBULATORY_CARE_PROVIDER_SITE_OTHER): Payer: BC Managed Care – PPO | Admitting: Physician Assistant

## 2016-11-11 ENCOUNTER — Telehealth: Payer: Self-pay | Admitting: Emergency Medicine

## 2016-11-11 VITALS — BP 104/70 | HR 85 | Temp 98.2°F | Ht 66.0 in | Wt 173.6 lb

## 2016-11-11 DIAGNOSIS — R05 Cough: Secondary | ICD-10-CM

## 2016-11-11 DIAGNOSIS — R109 Unspecified abdominal pain: Secondary | ICD-10-CM

## 2016-11-11 DIAGNOSIS — R059 Cough, unspecified: Secondary | ICD-10-CM

## 2016-11-11 MED ORDER — HYDROCOD POLST-CPM POLST ER 10-8 MG/5ML PO SUER: 5.0000 mL | Freq: Every evening | ORAL | 0 refills | Status: DC | PRN

## 2016-11-11 MED ORDER — MELOXICAM 15 MG PO TABS: 15.0000 mg | ORAL_TABLET | Freq: Every day | ORAL | 0 refills | Status: DC

## 2016-11-11 NOTE — Patient Instructions (Addendum)
Please let me know if your symptoms do not improve.   Please take cough suppressant at night.  Watch for sedative properties, and do not take when driving. Please do not take mobic with naproxen or ibuprofen with this.  You can apply ice to the area three times per day for 15 minutes.

## 2016-11-11 NOTE — Progress Notes (Signed)
[facility] [address] [place] [address] [phone]  Date:  11/11/2016   Name:  Jessica Branch   DOB:  Dec 18, 1960   MRN:  [id number]  PCP:  No primary care provider on file.    History of Present Illness:  Jessica Branch is a 56 y.o. female patient who presents to [facility] for abdominal pain of right side, and cough.   --cough, congestion for about 1 month.  And she has post-nasal drip.  [facility], and was given doxycycline.  She has had "forceful" coughing.  She has some improvement. Sharp pain at right abdominal side, she will have sharp pains with the coughing.  Started last night during a coughing fit.  It is resolved when she is not coughing.  Not aggravated with lifting objects.  No fever, dizziness, or constant abdominal pain, or nausea.  She has swelling that she has noted.   Chief Complaint  Patient presents with  . Cough    X 3-4 days  . Abdominal Pain    X 2 days abd pain rt side     Patient Active Problem List   Diagnosis Date Noted  . 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 Melanoma [facility])     Past Surgical History:  Procedure Laterality Date  . CESAREAN SECTION    . necrosis     . TUBAL LIGATION      Social History  Substance Use Topics  . Smoking status: Never Smoker  . Smokeless tobacco: Never Used  . Alcohol use No    History reviewed. No pertinent family history.  Allergies  Allergen Reactions  . Septra [Sulfamethoxazole-Trimethoprim] Other (See Comments)    Pain in pelvic area   . Amoxicillin Rash    Medication list has been reviewed and updated.  Current Outpatient Prescriptions on File Prior to Visit  Medication Sig Dispense Refill  . b complex vitamins capsule Take 1 capsule by mouth daily.    . calcium-vitamin D 250-100 MG-UNIT per tablet Take 1 tablet by mouth 2 (two)  times daily.    . Multiple Vitamins-Minerals (MULTIVITAMIN WITH MINERALS) tablet Take 1 tablet by mouth daily.     No current facility-administered medications on file prior to visit.     ROS ROS otherwise unremarkable unless listed above.    Physical Examination: BP 104/70 (BP Location: Right Arm, Patient Position: Sitting, Cuff Size: Small)   Pulse 85   Temp 98.2 F (36.8 C) (Oral)   Ht 5\' 6"  (1.676 m)   Wt 173 lb 9.6 oz (78.7 kg)   SpO2 95%   BMI 28.02 kg/m  Ideal Body Weight: Weight in (lb) to have BMI = 25: 154.6  Physical Exam  Constitutional: She is oriented to person, place, and time. She appears well-developed and well-nourished. No distress.  HENT:  Head: Normocephalic and atraumatic.  Right Ear: External ear normal.  Left Ear: External ear normal.  Eyes: Conjunctivae and EOM are normal. Pupils are equal, round, and reactive to light.  Cardiovascular: Normal rate.   Pulses:      Dorsalis pedis pulses are 2+ on the right side, and 2+ on the left side.  Pulmonary/Chest: Effort normal. No respiratory distress.  Abdominal:  No mass appreciated with tension to abdominal wall muscle.  Pain is incited with sitting up and clenching  the abdomen--though hernia is unappreciated.  No erythema, or swelling.    Neurological: She is alert and oriented to person, place, and time.  Skin: She is not diaphoretic.  Psychiatric: She has a normal mood and affect. Her behavior is normal.     Assessment and Plan: [name] is a 56 y.o. female who is here today for cc of abdominal pain and cough. Continue the doxycycline.  This is likely a muscle strain though there is a chance of a hernia.  I have advised that she return if her symptoms worsen or fail to improve.  Advised of alarming symptoms consistent with needing acute attention (ie hernia symptoms).  She will ice and use nsaid in the meantime. sxs continue, she can call within the week, and we can refer her to imaging.  Abdominal  wall pain - Plan: meloxicam (MOBIC) 15 MG tablet, chlorpheniramine-HYDROcodone (TUSSIONEX PENNKINETIC ER) 10-8 MG/5ML SUER, DISCONTINUED: chlorpheniramine-HYDROcodone (TUSSIONEX PENNKINETIC ER) 10-8 MG/5ML SUER  Cough - Plan: meloxicam (MOBIC) 15 MG tablet, chlorpheniramine-HYDROcodone (TUSSIONEX PENNKINETIC ER) 10-8 MG/5ML SUER, DISCONTINUED: chlorpheniramine-HYDROcodone (TUSSIONEX PENNKINETIC ER) 10-8 MG/5ML SUER  Ivar Drape, PA-C [facility] 1/10/20187:43 PM

## 2017-01-17 ENCOUNTER — Encounter: Payer: Self-pay | Admitting: Physician Assistant

## 2017-01-17 ENCOUNTER — Ambulatory Visit (INDEPENDENT_AMBULATORY_CARE_PROVIDER_SITE_OTHER): Payer: BC Managed Care – PPO | Admitting: Physician Assistant

## 2017-01-17 VITALS — BP 126/78 | HR 88 | Temp 98.6°F | Resp 16 | Ht 65.0 in | Wt 183.0 lb

## 2017-01-17 DIAGNOSIS — B009 Herpesviral infection, unspecified: Secondary | ICD-10-CM | POA: Insufficient documentation

## 2017-01-17 DIAGNOSIS — R768 Other specified abnormal immunological findings in serum: Secondary | ICD-10-CM | POA: Diagnosis not present

## 2017-01-17 DIAGNOSIS — R238 Other skin changes: Secondary | ICD-10-CM | POA: Diagnosis not present

## 2017-01-17 NOTE — Patient Instructions (Addendum)
  Valtrex 1000mg  - take 2 pills 3x/day for 10 days  When I get your labs I will let you know through mychart.  We are checking you for herpes.   IF you received an x-ray today, you will receive an invoice from [facility]. Please contact [facility] at [phone] with questions or concerns regarding your invoice.   IF you received labwork today, you will receive an invoice from [place]. Please contact [facility] at [phone] with questions or concerns regarding your invoice.   Our billing staff will not be able to assist you with questions regarding bills from these companies.  You will be contacted with the lab results as soon as they are available. The fastest way to get your results is to activate your My Chart account. Instructions are located on the last page of this paperwork. If you have not heard from [place] regarding the results in 2 weeks, please contact this office.

## 2017-01-17 NOTE — Progress Notes (Signed)
   Jessica Branch  MRN: [id number] DOB: 22-Oct-1961  PCP: No primary care provider on file.  Chief Complaint  Patient presents with  . Rash    on buttocks     Subjective:  Pt presents to clinic for rash in her gluteal cleft that started yesterday that is itchy.  No new lotions, soaps or detergents.  She tried some cortisone cream but she did wipe it off. She has tested positive for HSV in the past by blood work but she has never had an outbreak that she knows of.  Review of Systems  Skin: Positive for rash.    Patient Active Problem List   Diagnosis Date Noted  . 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 b complex vitamins capsule Take 1 capsule by mouth daily.    . calcium-vitamin D 250-100 MG-UNIT per tablet Take 1 tablet by mouth 2 (two) times daily.    . Multiple Vitamins-Minerals (MULTIVITAMIN WITH MINERALS) tablet Take 1 tablet by mouth daily.     No current facility-administered medications on file prior to visit.     Allergies  Allergen Reactions  . Septra [Sulfamethoxazole-Trimethoprim] Other (See Comments)    Pain in pelvic area   . Amoxicillin Rash    Pt patients past, family and social history were reviewed and updated.   Objective:  BP 126/78   Pulse 88   Temp 98.6 F (37 C)   Resp 16   Ht 5\' 5"  (1.651 m)   Wt 183 lb (83 kg)   SpO2 97%   BMI 30.45 kg/m   Physical Exam  Constitutional: She is oriented to person, place, and time and well-developed, well-nourished, and in no distress.  HENT:  Head: Normocephalic and atraumatic.  Right Ear: Hearing and external ear normal.  Left Ear: Hearing and external ear normal.  Eyes: Conjunctivae are normal.  Neck: Normal range of motion.  Pulmonary/Chest: Effort normal.    Neurological: She is alert and oriented to person, place, and time. Gait normal.  Skin: Skin is warm and dry.  Vesicular rash on right buttocks in the gluteal cleft  Psychiatric: Mood, memory, affect and judgment normal.  Vitals reviewed.   Assessment and Plan :  Vesicular rash - Plan: Virus culture  Seropositive for herpes simplex 2 infection   Pt has valtrex at home - she will use valtrex 500mg  2 pills tid for 10 days - we will check for viral culture to determine what type of virus so future treatment  Windell Hummingbird PA-C  [facility] 01/19/2017 10:17 AM

## 2017-01-21 ENCOUNTER — Telehealth: Payer: Self-pay | Admitting: Physician Assistant

## 2017-01-21 DIAGNOSIS — B009 Herpesviral infection, unspecified: Secondary | ICD-10-CM

## 2017-01-21 NOTE — Telephone Encounter (Signed)
See preliminary result

## 2017-01-21 NOTE — Telephone Encounter (Signed)
PATIENT STATES SHE SAW [facility] ON Saturday FOR SHINGLES. SHE DID SOME BLOOD WORK ON HER AND SHE WOULD LIKE TO GET THOSE RESULTS PLEASE. BEST PHONE [phone] (CELL) PHARMACY CHOICE IS [facility] ON [address]. [place]

## 2017-01-22 ENCOUNTER — Telehealth: Payer: Self-pay | Admitting: Emergency Medicine

## 2017-01-22 NOTE — Telephone Encounter (Signed)
Jessica Branch, This patient called in very upset that lab results were not back. She would not hang up the phone until she spoke with someone to give results.  I informed her, the test results came back positive for Herpes2. I see, you already covered her with anti-viral meds.  Her questions- should she continue taking treatment for 10 days or reduce dose?  Please advise

## 2017-01-23 NOTE — Telephone Encounter (Signed)
Pt advised , has been on valtrex 1000mg  tid since 01/18/17 and will go to bid for the remainder= 10 days and wait for your call

## 2017-01-23 NOTE — Telephone Encounter (Signed)
I am sorry that I did not get to call her - yes she is having her 1st herpes outbreak.  She should take Valtrex 1000mg  2x/day for 10 days.  I will call her to discuss next steps next week when I am back in the office.

## 2017-01-23 NOTE — Telephone Encounter (Signed)
lmtcb

## 2017-01-24 NOTE — Telephone Encounter (Signed)
Multiple message this has been dealt with in another message.

## 2017-01-25 LAB — VIRUS CULTURE

## 2017-01-26 ENCOUNTER — Other Ambulatory Visit: Payer: Self-pay | Admitting: Obstetrics and Gynecology

## 2017-01-26 DIAGNOSIS — Z1231 Encounter for screening mammogram for malignant neoplasm of breast: Secondary | ICD-10-CM

## 2017-01-27 ENCOUNTER — Other Ambulatory Visit: Payer: Self-pay | Admitting: Obstetrics and Gynecology

## 2017-01-27 DIAGNOSIS — M8580 Other specified disorders of bone density and structure, unspecified site: Secondary | ICD-10-CM

## 2017-01-27 MED ORDER — VALACYCLOVIR HCL 500 MG PO TABS: ORAL_TABLET | ORAL | 11 refills | Status: AC

## 2017-02-11 ENCOUNTER — Ambulatory Visit
Admission: RE | Admit: 2017-02-11 | Discharge: 2017-02-11 | Disposition: A | Discharge status: Home / Self Care | Payer: BC Managed Care – PPO | Source: Ambulatory Visit | Attending: Obstetrics and Gynecology | Admitting: Obstetrics and Gynecology

## 2017-02-11 DIAGNOSIS — Z1231 Encounter for screening mammogram for malignant neoplasm of breast: Secondary | ICD-10-CM

## 2017-02-17 ENCOUNTER — Ambulatory Visit
Admission: RE | Admit: 2017-02-17 | Discharge: 2017-02-17 | Disposition: A | Discharge status: Home / Self Care | Payer: BC Managed Care – PPO | Source: Ambulatory Visit | Attending: Obstetrics and Gynecology | Admitting: Obstetrics and Gynecology

## 2017-02-17 DIAGNOSIS — M8580 Other specified disorders of bone density and structure, unspecified site: Secondary | ICD-10-CM

## 2017-07-09 ENCOUNTER — Encounter: Payer: Self-pay | Admitting: Physician Assistant

## 2017-07-09 ENCOUNTER — Ambulatory Visit (INDEPENDENT_AMBULATORY_CARE_PROVIDER_SITE_OTHER): Payer: BC Managed Care – PPO | Admitting: Physician Assistant

## 2017-07-09 VITALS — BP 123/79 | HR 90 | Temp 98.8°F | Resp 18 | Ht 65.0 in | Wt 189.6 lb

## 2017-07-09 DIAGNOSIS — H8110 Benign paroxysmal vertigo, unspecified ear: Secondary | ICD-10-CM | POA: Diagnosis not present

## 2017-07-09 MED ORDER — MECLIZINE HCL 25 MG PO TABS: 25.0000 mg | ORAL_TABLET | Freq: Three times a day (TID) | ORAL | 0 refills | Status: AC | PRN

## 2017-07-09 MED ORDER — FLUTICASONE PROPIONATE 50 MCG/ACT NA SUSP: 2.0000 | Freq: Every day | NASAL | 0 refills | Status: DC

## 2017-07-09 NOTE — Patient Instructions (Addendum)
Dizziness Dizziness is a common problem. It makes you feel unsteady or light-headed. You may feel like you are about to pass out (faint). Dizziness can lead to getting hurt if you stumble or fall. Dizziness can be caused by many things, including:  Medicines.  Not having enough water in your body (dehydration).  Illness.  Follow these instructions at home: Eating and drinking  Drink enough fluid to keep your pee (urine) clear or pale yellow. This helps to keep you from getting dehydrated. Try to drink more clear fluids, such as water.  Do not drink alcohol.  Limit how much caffeine you drink or eat, if your doctor tells you to do that.  Limit how much salt (sodium) you drink or eat, if your doctor tells you to do that. Activity  Avoid making quick movements. ? When you stand up from sitting in a chair, steady yourself until you feel okay. ? In the morning, first sit up on the side of the bed. When you feel okay, stand slowly while you hold onto something. Do this until you know that your balance is fine.  If you need to stand in one place for a long time, move your legs often. Tighten and relax the muscles in your legs while you are standing.  Do not drive or use heavy machinery if you feel dizzy.  Avoid bending down if you feel dizzy. Place items in your home so you can reach them easily without leaning over. Lifestyle  Do not use any products that contain nicotine or tobacco, such as cigarettes and e-cigarettes. If you need help quitting, ask your doctor.  Try to lower your stress level. You can do this by using methods such as yoga or meditation. Talk with your doctor if you need help. General instructions  Watch your dizziness for any changes.  Take over-the-counter and prescription medicines only as told by your doctor. Talk with your doctor if you think that you are dizzy because of a medicine that you are taking.  Tell a friend or a family member that you are feeling  dizzy. If he or she notices any changes in your behavior, have this person call your doctor.  Keep all follow-up visits as told by your doctor. This is important. Contact a doctor if:  Your dizziness does not go away.  Your dizziness or light-headedness gets worse.  You feel sick to your stomach (nauseous).  You have trouble hearing.  You have new symptoms.  You are unsteady on your feet.  You feel like the room is spinning. Get help right away if:  You throw up (vomit) or have watery poop (diarrhea), and you cannot eat or drink anything.  You have trouble: ? Talking. ? Walking. ? Swallowing. ? Using your arms, hands, or legs.  You feel generally weak.  You are not thinking clearly, or you have trouble forming sentences. A friend or family member may notice this.  You have: ? Chest pain. ? Pain in your belly (abdomen). ? Shortness of breath. ? Sweating.  Your vision changes.  You are bleeding.  You have a very bad headache.  You have neck pain or a stiff neck.  You have a fever. These symptoms may be an emergency. Do not wait to see if the symptoms will go away. Get medical help right away. Call your local emergency services (911 in the [place] Do not drive yourself to the hospital. Summary  Dizziness makes you feel unsteady or light-headed. You may   feel like you are about to pass out (faint).  Drink enough fluid to keep your pee (urine) clear or pale yellow. Do not drink alcohol.  Avoid making quick movements if you feel dizzy.  Watch your dizziness for any changes. This information is not intended to replace advice given to you by your health care provider. Make sure you discuss any questions you have with your health care provider. Document Released: 10/09/2011 Document Revised: 11/06/2016 Document Reviewed: 11/06/2016 Elsevier Interactive Patient Education  2017 [employer]     IF you received an x-ray today, you will receive an invoice from  [facility]. Please contact [facility] at [phone] with questions or concerns regarding your invoice.   IF you received labwork today, you will receive an invoice from [facility]. Please contact [facility] at [phone] with questions or concerns regarding your invoice.   Our billing staff will not be able to assist you with questions regarding bills from these companies.  You will be contacted with the lab results as soon as they are available. The fastest way to get your results is to activate your My Chart account. Instructions are located on the last page of this paperwork. If you have not heard from us regarding the results in 2 weeks, please contact this office.      

## 2017-07-09 NOTE — Progress Notes (Signed)
PRIMARY CARE AT [facility] [address], [place] [address] [phone]  Date:  07/09/2017   Name:  Jessica Branch   DOB:  Dec 27, 1960   MRN:  [id number]  PCP:  Everlene Farrier, MD    History of Present Illness:  Jessica Branch is a 56 y.o. female patient who presents to PCP with  Chief Complaint  Patient presents with  . Dizziness    comes and goes  . Ear Pain    right ear gets a shooting pain every now and then     Intermittent right ear pain for 1 week.  Will turn her head.  She has a quick pain of her right ear.  It is tolerable, but is concerning.  She has mild post nasal drainage, but not congested.  No ear drainage.  No swimming.  Flipping over in bed, she may have room spinning, which goes away.   The dizziness has stopped and resolved at this time.  Patient Active Problem List   Diagnosis Date Noted  . 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 Melanoma [facility])     Past Surgical History:  Procedure Laterality Date  . CESAREAN SECTION    . necrosis     . TUBAL LIGATION      Social History  Substance Use Topics  . Smoking status: Never Smoker  . Smokeless tobacco: Never Used  . Alcohol use No    No family history on file.  Allergies  Allergen Reactions  . Septra [Sulfamethoxazole-Trimethoprim] Other (See Comments)    Pain in pelvic area   . Amoxicillin Rash    Medication list has been reviewed and updated.  Current Outpatient Prescriptions on File Prior to Visit  Medication Sig Dispense Refill  . b complex vitamins capsule Take 1 capsule by mouth daily.    . calcium-vitamin D 250-100 MG-UNIT per tablet Take 1 tablet by mouth 2 (two) times daily.    . Multiple Vitamins-Minerals (MULTIVITAMIN WITH MINERALS) tablet Take 1 tablet by mouth daily.    . valACYclovir (VALTREX) 500 MG tablet 1 po qd for  suppression,  Increase to 1 po bid for outbreak 60 tablet 11   No current facility-administered medications on file prior to visit.     ROS ROS otherwise unremarkable unless listed above.  Physical Examination: BP 123/79   Pulse 90   Temp 98.8 F (37.1 C) (Oral)   Resp 18   Ht 5\' 5"  (1.651 m)   Wt 189 lb 9.6 oz (86 kg)   SpO2 96%   BMI 31.55 kg/m  Ideal Body Weight: Weight in (lb) to have BMI = 25: 149.9  Physical Exam  Constitutional: She is oriented to person, place, and time. She appears well-developed and well-nourished. No distress.  HENT:  Head: Normocephalic and atraumatic.  Right Ear: External ear normal.  Left Ear: External ear normal.  Nose: Mucosal edema (mild) present.  Mouth/Throat: No oropharyngeal exudate, posterior oropharyngeal edema or posterior oropharyngeal erythema.  No rash along body, face or ear canal  Eyes: Pupils are equal, round, and reactive to light. Conjunctivae and EOM are normal.  Cardiovascular: Normal rate.   Pulmonary/Chest: Effort normal. No respiratory distress.  Neurological: She is alert and oriented to person, place, and time. She has normal strength. No cranial nerve deficit. Gait normal.  dix-hallpike was normal without  nystagmus or reports of dizziness.  Skin: She is not diaphoretic.  Psychiatric: She has a normal mood and affect. Her behavior is normal.     Assessment and Plan: Jessica Branch is a 56 y.o. female who is here today for cc of  Chief Complaint  Patient presents with  . Dizziness    comes and goes  . Ear Pain    right ear gets a shooting pain every now and then   Likely bppv This appears to be resolving.  given medication in case this resurfaces. Also advised Flonase to avoid eustachian tube dysfunction which may arouse symptoms.   rtc as needed.  Benign paroxysmal positional vertigo, unspecified laterality - Plan: meclizine (ANTIVERT) 25 MG tablet, fluticasone (FLONASE) 50 MCG/ACT nasal spray  Ivar Drape, PA-C [facility] 9/10/20189:00 AM

## 2017-07-21 ENCOUNTER — Telehealth: Payer: Self-pay | Admitting: Physician Assistant

## 2017-07-21 NOTE — Telephone Encounter (Signed)
Pt called stating we called and left a message for a pt named Adam to call us back on her vm. She said she could not make out what the last name was but it started with an L and she wanted to let us know the message did not get to him but went to her phone instead. Thanks. Pt's number is [phone].

## 2017-08-05 ENCOUNTER — Other Ambulatory Visit: Payer: Self-pay | Admitting: Physician Assistant

## 2017-08-05 DIAGNOSIS — H8110 Benign paroxysmal vertigo, unspecified ear: Secondary | ICD-10-CM

## 2018-02-10 ENCOUNTER — Encounter: Payer: Self-pay | Admitting: Physician Assistant

## 2018-03-19 ENCOUNTER — Other Ambulatory Visit: Payer: Self-pay | Admitting: Obstetrics and Gynecology

## 2018-03-19 DIAGNOSIS — Z1231 Encounter for screening mammogram for malignant neoplasm of breast: Secondary | ICD-10-CM

## 2018-04-15 ENCOUNTER — Ambulatory Visit
Admission: RE | Admit: 2018-04-15 | Discharge: 2018-04-15 | Disposition: A | Discharge status: Home / Self Care | Payer: BC Managed Care – PPO | Source: Ambulatory Visit | Attending: Obstetrics and Gynecology | Admitting: Obstetrics and Gynecology

## 2018-04-15 DIAGNOSIS — Z1231 Encounter for screening mammogram for malignant neoplasm of breast: Secondary | ICD-10-CM

## 2019-03-15 ENCOUNTER — Other Ambulatory Visit: Payer: Self-pay

## 2019-03-15 ENCOUNTER — Telehealth: Payer: BC Managed Care – PPO | Admitting: Family Medicine

## 2019-03-15 ENCOUNTER — Encounter: Payer: Self-pay | Admitting: Family Medicine

## 2019-03-15 NOTE — Progress Notes (Signed)
Pt c/o medication refill of valtrex.

## 2019-03-15 NOTE — Progress Notes (Signed)
Telemedicine Encounter- SOAP NOTE Established Patient  I discussed the limitations, risks, security and privacy concerns of performing an evaluation and management service by telephone and the availability of in person appointments. I also discussed with the patient that there may be a patient responsible charge related to this service. The patient expressed understanding and agreed to proceed.  This telephone encounter was conducted with the patient's (or proxy's) verbal consent via audio telecommunications: yes Patient was instructed to have this encounter in a suitably private space; and to only have persons present to whom they give permission to participate. In addition, patient identity was confirmed by use of name plus two identifiers (DOB and address).  I spent a total of 5 mintalking with the patient or their proxy.  Pt states she no longer needs medication refilled. She found a new primary care near her home   Patient Active Problem List   Diagnosis Date Noted  . 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 Melanoma ([place])     Current Outpatient Medications  Medication Sig Dispense Refill  . b complex vitamins capsule Take 1 capsule by mouth daily.    . calcium-vitamin D 250-100 MG-UNIT per tablet Take 1 tablet by mouth 2 (two) times daily.    . Multiple Vitamins-Minerals (MULTIVITAMIN WITH MINERALS) tablet Take 1 tablet by mouth daily.    . valACYclovir (VALTREX) 500 MG tablet 1 po qd for suppression,  Increase to 1 po bid for outbreak 60 tablet 11  . fluticasone (FLONASE) 50 MCG/ACT nasal spray SPRAY 2 SPRAYS INTO EACH NOSTRIL EVERY DAY (Patient not taking: Reported on 03/15/2019) 18.2 g 0  . meclizine (ANTIVERT) 25 MG tablet Take 1 tablet (25 mg total) by mouth 3 (three) times daily as needed for dizziness. (Patient not  taking: Reported on 03/15/2019) 30 tablet 0   No current facility-administered medications for this visit.     Allergies  Allergen Reactions  . Septra [Sulfamethoxazole-Trimethoprim] Other (See Comments)    Pain in pelvic area   . Amoxicillin Rash    Social History   Socioeconomic History  . Marital status: Divorced    Spouse name: Not on file  . Number of children: Not on file  . Years of education: Not on file  . Highest education level: Not on file  Occupational History  . Not on file  Social Needs  . Financial resource strain: Not on file  . Food insecurity:    Worry: Not on file    Inability: Not on file  . Transportation needs:    Medical: Not on file    Non-medical: Not on file  Tobacco Use  . Smoking status: Never Smoker  . Smokeless tobacco: Never Used  Substance and Sexual Activity  . Alcohol use: No    Alcohol/week: 0.0 standard drinks  . Drug use: No  . Sexual activity: Not on file  Lifestyle  . Physical activity:    Days per week: Not on file    Minutes per session: Not on file  . Stress: Not on file  Relationships  . Social connections:    Talks on phone: Not on file    Gets together: Not on file    Attends religious service: Not on file    Active member of club or organization: Not on file  Attends meetings of clubs or organizations: Not on file    Relationship status: Not on file  . Intimate partner violence:    Fear of current or ex partner: Not on file    Emotionally abused: Not on file    Physically abused: Not on file    Forced sexual activity: Not on file  Other Topics Concern  . Not on file  Social History Narrative  . Not on file     pt no longer wants to be seen at [facility] -relocated to [facility], MD  Primary Care at [facility] 03-14-19

## 2019-12-31 ENCOUNTER — Ambulatory Visit: Payer: BC Managed Care – PPO

## 2022-05-22 ENCOUNTER — Ambulatory Visit: Payer: BC Managed Care – PPO | Admitting: Rehabilitative and Restorative Service Providers"

## 2024-12-30 ENCOUNTER — Ambulatory Visit: Admitting: Family Medicine
